# Patient Record
Sex: Female | Born: 1970 | Race: White | Hispanic: No | Marital: Married | Smoking: Never smoker
Health system: Southern US, Community
[De-identification: ages and names within clinical notes are randomized; demographics above are authoritative.]

## PROBLEM LIST (undated history)

## (undated) HISTORY — PX: EYE SURGERY: SHX253

---

## 2011-04-10 ENCOUNTER — Other Ambulatory Visit: Payer: Self-pay | Admitting: Obstetrics and Gynecology

## 2011-04-10 DIAGNOSIS — N644 Mastodynia: Secondary | ICD-10-CM

## 2011-04-23 ENCOUNTER — Ambulatory Visit
Admission: RE | Admit: 2011-04-23 | Discharge: 2011-04-23 | Disposition: A | Discharge status: Home / Self Care | Payer: BC Managed Care – PPO | Source: Ambulatory Visit | Attending: Obstetrics and Gynecology | Admitting: Obstetrics and Gynecology

## 2011-04-23 DIAGNOSIS — N644 Mastodynia: Secondary | ICD-10-CM

## 2017-03-12 ENCOUNTER — Telehealth: Payer: Self-pay | Admitting: Gastroenterology

## 2017-03-12 NOTE — Telephone Encounter (Signed)
Patient called back confirmed appointment on 03/18/17 at 9:30am states she did not have any other questions.

## 2017-03-12 NOTE — Telephone Encounter (Signed)
11/26 @ 930 am appt with Christella HartiganJacobs

## 2017-03-12 NOTE — Telephone Encounter (Signed)
Left message on machine to call back  

## 2017-03-12 NOTE — Telephone Encounter (Signed)
Allison Walter has had some chronic right lower quadrant pains for several months.  She called me last night to talk about it.  Her gynecologist has referred her to see me.  [name], can you find a time next week for an office appointment with me, double book if needed, preferably in the afternoon  I believe she's had labs and CT scan through her gynecologist office.  Can you get those sent here for review as well.  Thanks her contact number, cell number is [phone]

## 2017-03-18 ENCOUNTER — Ambulatory Visit (INDEPENDENT_AMBULATORY_CARE_PROVIDER_SITE_OTHER): Payer: BLUE CROSS/BLUE SHIELD | Admitting: Gastroenterology

## 2017-03-18 ENCOUNTER — Encounter (INDEPENDENT_AMBULATORY_CARE_PROVIDER_SITE_OTHER): Payer: Self-pay

## 2017-03-18 ENCOUNTER — Encounter: Payer: Self-pay | Admitting: Gastroenterology

## 2017-03-18 VITALS — BP 108/82 | HR 76 | Ht 63.0 in | Wt 138.4 lb

## 2017-03-18 DIAGNOSIS — R1031 Right lower quadrant pain: Secondary | ICD-10-CM

## 2017-03-18 NOTE — Patient Instructions (Addendum)
Records from PCP(labs, imaging results) Billee Cashing(Sarah Spencer MD through [facility]).  You will be set up for an ultrasound in 6 months (liver lesion in CT 02/2017 that was likely hemangioma.  Follow up PRN (call Dr. Christella HartiganJacobs if you want to proceed with colonoscopy).  Normal BMI (Body Mass Index- based on height and weight) is between 19 and 25. Your BMI today is Body mass index is 24.51 kg/m. Marland Kitchen. Please consider follow up  regarding your BMI with your Primary Care Provider.

## 2017-03-18 NOTE — Progress Notes (Signed)
HPI: This is a   very pleasant 46 year old woman who is a friend of mine, who was referred by her primary care physician for chronic right lower quadrant pain  Chief complaint is chronic right lower quadrant, low right pelvic pain  She has had a nagging, pinching sensation in her right lower quadrant, right pelvis for about 8 or 9 months now.  It is constant, not affected by moving her bowels or eating.  No position changes alter the pain.  She rates it 2 or 3 out of 10.  She cannot point to any specific injuries that occurred.  She has normal bowels without bleeding that have not changed in a long time.  Her weight is overall been stable  She has had transvaginal ultrasound, CT scan and blood work by her gynecologist and primary care physician.  These were all essentially normal per the patient and she showed me some of the results on her phone as well.   Old Data Reviewed: Cbc, cmet 02/2017 was normal. CT scan IV abd pelvis was normal except hemgangima in liver (fu US in 6 months)    Review of systems: Pertinent positive and negative review of systems were noted in the above HPI section. All other review negative.   History reviewed. No pertinent past medical history.  Past Surgical History:  Procedure Laterality Date  . EYE SURGERY Bilateral     No current outpatient medications on file.   No current facility-administered medications for this visit.     Allergies as of 03/18/2017  . (Not on File)    Family History  Problem Relation Age of Onset  . Dementia Father     Social History   Socioeconomic History  . Marital status: Married    Spouse name: Not on file  . Number of children: Not on file  . Years of education: Not on file  . Highest education level: Not on file  Social Needs  . Financial resource strain: Not on file  . Food insecurity - worry: Not on file  . Food insecurity - inability: Not on file  . Transportation needs - medical: Not on file  .  Transportation needs - non-medical: Not on file  Occupational History  . Not on file  Tobacco Use  . Smoking status: Never Smoker  . Smokeless tobacco: Never Used  Substance and Sexual Activity  . Alcohol use: Yes    Alcohol/week: 1.2 oz    Types: 2 Glasses of wine per week    Comment: 5 per week  . Drug use: No  . Sexual activity: Yes  Other Topics Concern  . Not on file  Social History Narrative  . Not on file     Physical Exam: BP 108/82   Pulse 76   Ht 5\' 3"  (1.6 m)   Wt 138 lb 6 oz (62.8 kg)   BMI 24.51 kg/m  Constitutional: generally well-appearing Psychiatric: alert and oriented x3 Eyes: extraocular movements intact Mouth: oral pharynx moist, no lesions Neck: supple no lymphadenopathy Cardiovascular: heart regular rate and rhythm Lungs: clear to auscultation bilaterally Abdomen: soft, very mildly tender right low pelvis, right lower quadrant, nondistended, no obvious ascites, no peritoneal signs, normal bowel sounds Extremities: no lower extremity edema bilaterally Skin: no lesions on visible extremities   Assessment and plan: 46 y.o. female with chronic right lower quadrant, right pelvic low pain  Her pain is not associated with eating or moving her bowels or position changes.  Does not qualify as  irritable bowel or functional dyspepsia.  She has had outside imaging including CT scan abdomen pelvis, transvaginal ultrasound.  These were normal except for a small hemangioma in her liver is incidental.  I am going to arrange right upper quadrant ultrasound for follow-up to make sure this is a hemangioma nothing else in 6 months.  We will get records sent from her primary care and her gynecologist.   think this is very unlikely anything serious.  Perhaps she has de novo adhesive disease.  Perhaps pinched nerve.  Not sure that it is GI related.  I did explain that colonoscopy is commonly offered for workup to exclude Crohn's disease, neoplasm both of which I think are  highly unlikely.  In the end would not follow this clinically and she will let me know if she wants to proceed with colonoscopy at a time.    Please see the "Patient Instructions" section for addition details about the plan.   Rob Buntinganiel Bomani Oommen, MD [facility] 03/18/2017, 9:47 AM  Cc: No ref. provider found

## 2017-09-17 ENCOUNTER — Telehealth: Payer: Self-pay

## 2017-09-17 NOTE — Telephone Encounter (Signed)
-----   Message from Brandt Loosen, RN sent at 03/18/2017 10:05 AM EST ----- You will be set up for an ultrasound in 6 months (liver lesion in CT 02/2017 that was likely hemangioma.

## 2017-09-17 NOTE — Telephone Encounter (Signed)
Letter mailed to have the pt call and set up repeat US

## 2018-07-07 DIAGNOSIS — Z131 Encounter for screening for diabetes mellitus: Secondary | ICD-10-CM | POA: Diagnosis not present

## 2018-07-07 DIAGNOSIS — D3500 Benign neoplasm of unspecified adrenal gland: Secondary | ICD-10-CM | POA: Diagnosis not present

## 2018-07-07 DIAGNOSIS — E559 Vitamin D deficiency, unspecified: Secondary | ICD-10-CM | POA: Diagnosis not present

## 2018-07-07 DIAGNOSIS — Z13220 Encounter for screening for lipoid disorders: Secondary | ICD-10-CM | POA: Diagnosis not present

## 2018-07-07 DIAGNOSIS — Z0000 Encounter for general adult medical examination without abnormal findings: Secondary | ICD-10-CM | POA: Diagnosis not present

## 2018-07-07 DIAGNOSIS — D1800 Hemangioma unspecified site: Secondary | ICD-10-CM | POA: Diagnosis not present

## 2018-09-23 DIAGNOSIS — D1800 Hemangioma unspecified site: Secondary | ICD-10-CM | POA: Diagnosis not present

## 2018-09-23 DIAGNOSIS — Z01419 Encounter for gynecological examination (general) (routine) without abnormal findings: Secondary | ICD-10-CM | POA: Diagnosis not present

## 2018-09-23 DIAGNOSIS — R011 Cardiac murmur, unspecified: Secondary | ICD-10-CM | POA: Diagnosis not present

## 2018-09-23 DIAGNOSIS — D3500 Benign neoplasm of unspecified adrenal gland: Secondary | ICD-10-CM | POA: Diagnosis not present

## 2018-09-24 ENCOUNTER — Telehealth: Payer: Self-pay | Admitting: *Deleted

## 2018-09-24 NOTE — Telephone Encounter (Signed)
REFERRAL SENT TO SCHEDULING AND NOTES ON FILR FROM [facility] FROM [place] [phone]

## 2018-10-14 DIAGNOSIS — N841 Polyp of cervix uteri: Secondary | ICD-10-CM | POA: Diagnosis not present

## 2018-10-17 DIAGNOSIS — H524 Presbyopia: Secondary | ICD-10-CM | POA: Diagnosis not present

## 2020-02-23 ENCOUNTER — Other Ambulatory Visit: Payer: Self-pay

## 2020-02-23 ENCOUNTER — Encounter: Payer: Self-pay | Admitting: Internal Medicine

## 2020-02-23 ENCOUNTER — Ambulatory Visit (INDEPENDENT_AMBULATORY_CARE_PROVIDER_SITE_OTHER): Payer: BC Managed Care – PPO | Admitting: Internal Medicine

## 2020-02-23 VITALS — BP 140/88 | HR 75 | Ht 63.0 in | Wt 140.2 lb

## 2020-02-23 DIAGNOSIS — E063 Autoimmune thyroiditis: Secondary | ICD-10-CM

## 2020-02-23 DIAGNOSIS — E278 Other specified disorders of adrenal gland: Secondary | ICD-10-CM | POA: Diagnosis not present

## 2020-02-23 LAB — POTASSIUM: Potassium: 3.8 mEq/L (ref 3.5–5.1)

## 2020-02-23 MED ORDER — DEXAMETHASONE 1 MG PO TABS: ORAL_TABLET | ORAL | 0 refills | Status: AC

## 2020-02-23 NOTE — Patient Instructions (Addendum)
Please stop at the lab.  Please take the dexamethasone around 11 PM the night before coming for an 8 AM cortisol level.  We will order a new CT scan as discussed.  Please come back for a follow-up appointment in 1 year.   Adrenal Adenoma  An adrenal adenoma is a benign tumor of the glands that are located on top of each kidney (adrenal glands). These glands produce hormones. A benign tumor means that the growth is not cancer. A person may have one or more tumors in one or both glands. In almost all cases, adrenal adenomas do not cause any symptoms. These are called nonfunctional adenomas. In rare cases, an adenoma may produce high levels of hormones called cortisol or aldosterone. These tumors are called functional adenomas. Adrenal adenomas become more common as people grow older, but the tumors do not become cancer. However, nonfunctional adenomas may become functional. What are the causes? In most cases, the cause of this condition is not known. In very rare cases, the condition may be passed from parent to child (inherited). These include multiple endocrine neoplasia type 1 and familial adenomatous polyposis. These conditions cause adenomas in many areas of the body. What are the signs or symptoms? Symptoms of this condition depend on the type of adrenal adenoma that you have.  Nonfunctional adrenal adenomas usually do not cause any symptoms.  Symptoms of functional adrenal adenomas depend on which hormone is produced in high levels. ? Tumors that secrete cortisol cause a condition called Cushing's syndrome. Signs and symptoms include:  Increased fat in the upper body.  Tiredness and loss of energy.  Muscle weakness.  High blood pressure.  High blood sugar.  Bruising and purple stretch marks in the skin, usually on the upper body.  Facial hair, acne, and menstrual irregularities in women. ? Tumors that secrete aldosterone cause a condition called primary aldosteronism. Signs  and symptoms include:  High blood pressure that may be difficult to control.  Tiredness and loss of energy.  Headache.  Weakness or numbness.  Low potassium, low magnesium, or high sodium levels in your blood. How is this diagnosed? This condition may be diagnosed based on:  Your symptoms. Your health care provider may suspect the condition if you have signs and symptoms of a functional adenoma.  A physical exam.  Blood and urine tests to check for high levels of hormones.  Imaging studies to confirm the diagnosis. These may include: ? CT scan. ? MRI. ? PET scan.  Biopsy. For this test, a sample of the tumor is removed and examined in a lab. This is done in rare cases where other tests have not given a clear result. Adrenal adenomas are often found by chance when imaging studies of the abdomen are done for other reasons. How is this treated? Treatment for this condition depends on the type of the adrenal adenoma that you have. You may treated with:  Observation. This is done if you have a nonfunctional adrenal adenoma. For observation, you may need: ? Regular imaging studies to make sure the tumor is not growing. ? Blood or urine tests to make sure the tumor is not becoming functional.  Surgery. This is done if you have a functional adenoma. Surgery is the main treatment for this condition and usually cures it.  Medicines. These are used if surgery is not possible. The medicines block the effects of the hormones. Follow these instructions at home:  Take over-the-counter and prescription medicines only as told by  your health care provider.  Return to your normal activities as told by your health care provider. Ask your health care provider what activities are safe for you.  Keep all follow-up visits as told by your health care provider. This is important. This may include visits for regular tests and imaging studies. Contact a health care provider if:  You develop any of  the signs or symptoms of Cushing's syndrome or primary aldosteronism. Summary  An adrenal adenoma is a benign tumor of the adrenal gland.  Nonfunctional adenomas rarely cause symptoms and do not need to be treated.  Functional adenomas may cause symptoms of Cushing's syndrome or primary aldosteronism.  Adrenal adenomas do not become cancerous. Nonfunctional adenomas may become functional.  Surgery to remove the tumor is the usual treatment for functional adenomas. This information is not intended to replace advice given to you by your health care provider. Make sure you discuss any questions you have with your health care provider. Document Revised: 04/17/2018 Document Reviewed: 04/17/2018 Elsevier Patient Education  2020 [employer].

## 2020-02-23 NOTE — Progress Notes (Addendum)
Patient ID: Allison Walter, female   DOB: 1971/01/10, 49 y.o.   MRN: [id number]   This visit occurred during the SARS-CoV-2 public health emergency.  Safety protocols were in place, including screening questions prior to the visit, additional usage of staff PPE, and extensive cleaning of exam room while observing appropriate contact time as indicated for disinfecting solutions.   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  Pt's adrenal mass was incidentally found during investigation for a hepatic hemangioma diagnosed 02/2017.  I reviewed pt's previous imaging tests: CT with contrast (03/11/2017): The right adrenal gland is normal. There is a 9 mm nodule in the left adrenal gland which has Hounsfield units measuring 27 on the portal venous phase images and 16 on the delayed phase images, changes most consistent with a small adenoma.   Of note, she does not have a history of hypokalemia. 07/07/2018: Glucose 85, BUN/creatinine 9/0.71, potassium 4.2 (3.5-5.2)  She has regular menses.  No h/o HTN, glucose intolerance.   No weight gain in last 2 years.   No skin changes including purple, wide, stretch marks. She does have hair loss - chronic.  She has a history of an abnormal TSH in 2016, after which her TSH levels normalized.  Recent antithyroid antibody levels were elevated.: 10/2019: TSH 2.61, free T4 1.0, free T3 3.1, TPO 220 (<9), ATA 5 (<1) 03/11/2017: TSH 3.72 01/01/2006: TSH 3.49 07/28/2014: TSH 5.43 (0.45-4.5) No results found for: TSH   No FH of Thyroid ds. No h/o RxTx to head and neck.  On B complex (with 400 mcg Biotin), last dose yesterday. On L-theanine.  ROS: Constitutional: no weight gain/loss, no fatigue, no subjective hyperthermia/hypothermia Eyes: no blurry vision, no xerophthalmia ENT: no sore throat, no nodules palpated in throat, no dysphagia/odynophagia, no  hoarseness Cardiovascular: no CP/SOB/palpitations/leg swelling Respiratory: no cough/SOB Gastrointestinal: no N/V/D/C Musculoskeletal: no muscle/joint aches Skin: no rashes, + hair loss Neurological: no tremors/numbness/tingling/dizziness Psychiatric: no depression/anxiety  History reviewed. No pertinent past medical history. Past Surgical History:  Procedure Laterality Date  . EYE SURGERY Bilateral    Blepharoplasty   Social History   Socioeconomic History  . Marital status: Married    Spouse name: Not on file  . Number of children: 1  . Years of education: Not on file  . Highest education level: Not on file  Occupational History  . Occupation: Doctor, general practice at [employer]  Tobacco Use  . Smoking status: Never Smoker  . Smokeless tobacco: Never Used  Vaping Use  . Vaping Use: Never used  Substance and Sexual Activity  . Alcohol use: Yes    Alcohol/week: 2.0 - 3.0 standard drinks    Types: 2 - 3 Glasses of wine per week    Comment: 4 per week  . Drug use: No  . Sexual activity: Yes  Other Topics Concern  . Not on file  Social History Narrative   Exercises: Aerobic and strengthening 2-4 times a week   Social Determinants of Health   Financial Resource Strain:   . Difficulty of Paying Living Expenses: Not on file  Food Insecurity:   . Worried About Programme researcher, broadcasting/film/video in the Last Year: Not on file  . Ran Out of Food in the Last Year: Not on file  Transportation Needs:   . Lack of Transportation (Medical): Not on file  . Lack of Transportation (Non-Medical): Not on  file  Physical Activity:   . Days of Exercise per Week: Not on file  . Minutes of Exercise per Session: Not on file  Stress:   . Feeling of Stress : Not on file  Social Connections:   . Frequency of Communication with Friends and Family: Not on file  . Frequency of Social Gatherings with Friends and Family: Not on file  . Attends Religious Services: Not on file  . Active Member of Clubs or  Organizations: Not on file  . Attends Banker Meetings: Not on file  . Marital Status: Not on file  Intimate Partner Violence:   . Fear of Current or Ex-Partner: Not on file  . Emotionally Abused: Not on file  . Physically Abused: Not on file  . Sexually Abused: Not on file   No current outpatient medications on file prior to visit.   No current facility-administered medications on file prior to visit.   No Known Allergies Family History  Problem Relation Age of Onset  . Dementia Father     PE: BP 140/88   Pulse 75   Ht 5\' 3"  (1.6 m)   Wt 140 lb 3.2 oz (63.6 kg)   SpO2 99%   BMI 24.84 kg/m  Wt Readings from Last 3 Encounters:  02/23/20 140 lb 3.2 oz (63.6 kg)  03/18/17 138 lb 6 oz (62.8 kg)   Constitutional: normal weight, in NAD Eyes: PERRLA, EOMI, no exophthalmos, no lid lag, no stare ENT: moist mucous membranes, no thyromegaly, no cervical lymphadenopathy Cardiovascular: RRR, No MRG Respiratory: CTA B Gastrointestinal: abdomen soft, NT, ND, BS+ Musculoskeletal: no deformities, strength intact in all 4 Skin: moist, warm, no rashes Neurological: no tremor with outstretched hands, DTR normal in all 4  ASSESSMENT: 1. Adrenal incidentaloma  2.  Hashimoto's thyroiditis  PLAN:  1. Patient with a L adrenal nodule discovered incidentally. - We discussed about the fact that there are 3 possible scenarios: - A nonfunctioning adrenal nodule  - A functioning adrenal adenoma - which can hypersecrete catecholamines/metanephrines, cortisol, or aldosterone -however, she does not have any symptoms of hyper production - Adrenal cancer/metastasis -no previous history of cancer - To differentiate between a functioning and a nonfunctioning adrenal nodule, we'll need to rule out hypersecretion by checking the following tests: - dexamethasone suppression test to rule out Cushing syndrome (6% of adrenal incidentalomas). If the cortisol level returns >5, will need 24h urine  free cortisol - Plasma fractionated metanephrines and catecholamines to rule out pheochromocytoma (3% of adrenal incidentalomas) - Plasma renin activity and aldosterone level to rule out primary hyperaldosteronism (0.6% of adrenal incidentalomas) - I ordered the above tests today. I advised pt to take the dexamethasone tablets (1 mg total dose, sent to pharmacy) at 11 PM the night before coming to the lab to have a cortisol level drawn. I also added dexamethasone level. -We reviewed the results of her CT scan from 2018.  This was done with contrast, so it cannot be inferred whether she has a lipid-rich nodule (for which, no further investigation would be needed) or an adenoma. W discussed about the need for another, adrenal CT. I will need to order it with and without contrast, if the Hounsfield units are low and the lesion again appears benign, we might not need to get the contrasted CT for washout.  - we discussed about f/u:   hormonal testing yearly for 5 years   CT scans yearly x1-2  - I explained all the above to  the patient, and she agrees with the plan. - I will see her back in a year  2.  Hashimoto's thyroiditis -She had a slightly abnormal TSH in 2016, after which, levels normalized -She recently had detailed thyroid labs in 10/2019 by another provider  - all TFTs were normal, but antithyroid antibodies were elevated, pointing towards Hashimoto's thyroiditis - we had a long discussion about her Hashimoto thyroiditis diagnosis. I explained that this is an autoimmune disorder, in which she develops antibodies against her own thyroid. The antibodies bind to the thyroid tissue and cause inflammation, and, eventually, destruction of the gland and hypothyroidism. We don't know how long this process can be, it can last from months to years. As of now, based on the last results that I have, her thyroid tests are normal. - I also explained that thyroid enlargement especially at the beginning of her  Hashimoto thyroiditis course is not uncommon, and it has a waxing and waning character.  - We discussed about treatment for Hashimoto thyroiditis, which is actually limited to thyroid hormones in case her TFTs are abnormal. Supplements like selenium has been tried with various results, some showing improvement in the TPO antibodies. However, there are no randomized controlled trials of this are consistent results between trials. We also discussed about ways to improve her immune system (relaxation, diet, exercise, sleep) to reduce the Ab titer and, subsequently, the thyroid inflammation.  She already eliminated dairy.   -We can continue to follow her for this-I will recheck her TFTs at next visit.  Component     Latest Ref Rng & Units 02/23/2020  Epinephrine     pg/mL 22  Norepinephrine     pg/mL 726  Dopamine     pg/mL <10  Total Catecholamines     pg/mL 748  Metanephrine, Pl     <=57 pg/mL <25  Normetanephrine, Pl     <=148 pg/mL 110  Total Metanephrines-Plasma     <=205 pg/mL 110  ALDOSTERONE     ng/dL 2  Renin Activity     5.46 - 5.82 ng/mL/h 1.02  ALDO / PRA Ratio     0.9 - 28.9 Ratio 2.0  Potassium     3.5 - 5.1 mEq/L 3.8  All labs are normal.  CT abdomen with and without contrast (03/22/2020): Lower chest: Lung bases are clear. Heart size normal. No pericardial or pleural effusion.  Hepatobiliary: Subcentimeter low-attenuation lesion in the periphery of the right hepatic lobe, too small to characterize but likely a benign lesion such as a cyst or hemangioma. Liver and gallbladder are otherwise unremarkable. No biliary ductal dilatation.  Pancreas: Negative.  Spleen: Negative.  Adrenals/Urinary Tract: Right adrenal gland is unremarkable. 1.5 cm nodule in the left adrenal gland is fluid in density on precontrast imaging. Tiny stones in the kidneys. Millimetric low-attenuation lesion in the right kidney is too small to characterize but statistically, a cyst is  likely.  Stomach/Bowel: Stomach and visualized portions of the small bowel and colon are unremarkable.  Vascular/Lymphatic: Vascular structures are unremarkable. No pathologically enlarged lymph nodes.  Other: No free fluid.  Mesenteries and peritoneum are unremarkable.  Musculoskeletal: None.  IMPRESSION: 1. Benign left adrenal adenoma. 2. Punctate bilateral renal stones.   Electronically Signed   By: Leanna Battles M.D.   On: 03/23/2020 09:18    Carlus Pavlov, MD PhD [facility]

## 2020-03-08 LAB — METANEPHRINES, PLASMA
Metanephrine, Free: <25 pg/mL (ref <=57)
Normetanephrine, Free: 110 pg/mL (ref <=148)
Total Metanephrines-Plasma: 110 pg/mL (ref <=205)

## 2020-03-08 LAB — CATECHOLAMINES, FRACTIONATED, PLASMA
Dopamine: <10 pg/mL
Epinephrine: 22 pg/mL
Norepinephrine: 726 pg/mL
Total Catecholamines: 748 pg/mL

## 2020-03-08 LAB — ALDOSTERONE + RENIN ACTIVITY W/ RATIO
ALDO / PRA Ratio: 2 Ratio (ref 0.9–28.9)
Aldosterone: 2 ng/dL
Renin Activity: 1.02 ng/mL/h (ref 0.25–5.82)

## 2020-03-22 ENCOUNTER — Ambulatory Visit
Admission: RE | Admit: 2020-03-22 | Discharge: 2020-03-22 | Disposition: A | Discharge status: Home / Self Care | Payer: BC Managed Care – PPO | Source: Ambulatory Visit | Attending: Internal Medicine | Admitting: Internal Medicine

## 2020-03-22 DIAGNOSIS — E278 Other specified disorders of adrenal gland: Secondary | ICD-10-CM | POA: Diagnosis not present

## 2020-03-22 DIAGNOSIS — N200 Calculus of kidney: Secondary | ICD-10-CM | POA: Diagnosis not present

## 2020-03-22 DIAGNOSIS — D3502 Benign neoplasm of left adrenal gland: Secondary | ICD-10-CM | POA: Diagnosis not present

## 2020-03-22 MED ORDER — IOPAMIDOL (ISOVUE-300) INJECTION 61%
100.0000 mL | Freq: Once | INTRAVENOUS | Status: AC | PRN
  Administered 2020-03-22: 100 mL via INTRAVENOUS

## 2020-04-19 DIAGNOSIS — D369 Benign neoplasm, unspecified site: Secondary | ICD-10-CM | POA: Diagnosis not present

## 2020-04-19 DIAGNOSIS — Z1211 Encounter for screening for malignant neoplasm of colon: Secondary | ICD-10-CM | POA: Diagnosis not present

## 2020-04-19 DIAGNOSIS — Z0000 Encounter for general adult medical examination without abnormal findings: Secondary | ICD-10-CM | POA: Diagnosis not present

## 2020-04-19 DIAGNOSIS — D1803 Hemangioma of intra-abdominal structures: Secondary | ICD-10-CM | POA: Diagnosis not present

## 2020-04-21 DIAGNOSIS — Z1231 Encounter for screening mammogram for malignant neoplasm of breast: Secondary | ICD-10-CM | POA: Diagnosis not present

## 2020-04-28 DIAGNOSIS — Z131 Encounter for screening for diabetes mellitus: Secondary | ICD-10-CM | POA: Diagnosis not present

## 2020-04-28 DIAGNOSIS — Z130 Encounter for screening for diseases of the blood and blood-forming organs and certain disorders involving the immune mechanism: Secondary | ICD-10-CM | POA: Diagnosis not present

## 2020-04-28 DIAGNOSIS — E063 Autoimmune thyroiditis: Secondary | ICD-10-CM | POA: Diagnosis not present

## 2020-04-28 DIAGNOSIS — Z13220 Encounter for screening for lipoid disorders: Secondary | ICD-10-CM | POA: Diagnosis not present

## 2020-04-28 DIAGNOSIS — E559 Vitamin D deficiency, unspecified: Secondary | ICD-10-CM | POA: Diagnosis not present

## 2020-07-05 DIAGNOSIS — H5212 Myopia, left eye: Secondary | ICD-10-CM | POA: Diagnosis not present

## 2020-07-05 DIAGNOSIS — H524 Presbyopia: Secondary | ICD-10-CM | POA: Diagnosis not present

## 2020-07-05 DIAGNOSIS — H52222 Regular astigmatism, left eye: Secondary | ICD-10-CM | POA: Diagnosis not present

## 2021-02-18 IMAGING — CT CT ABDOMEN WO/W CM
3 of 10 series · 10 of 46 positions shown, 16 images · IV contrast (iopamidol)
Comparison: None.

CLINICAL DATA: Adrenal mass.

EXAM:
CT ABDOMEN WITHOUT AND WITH CONTRAST
TECHNIQUE: Multidetector CT imaging of the abdomen was performed following the
standard protocol before and following the bolus administration of
intravenous contrast.
CONTRAST:  100mL 1YZKJT-J99 IOPAMIDOL ([id number]-J99) INJECTION 61%

[Series 2: adrenals 3.00 br40 s3 ax without · axial · non-contrast · 0.65mm/px · z∈[+1276,+1303]mm · 2 of 69 slices shown]
[im 9/69  soft-tissue]
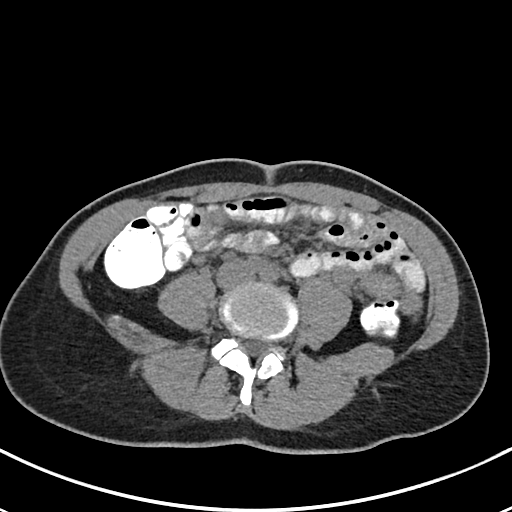
[im 18/69  soft-tissue]
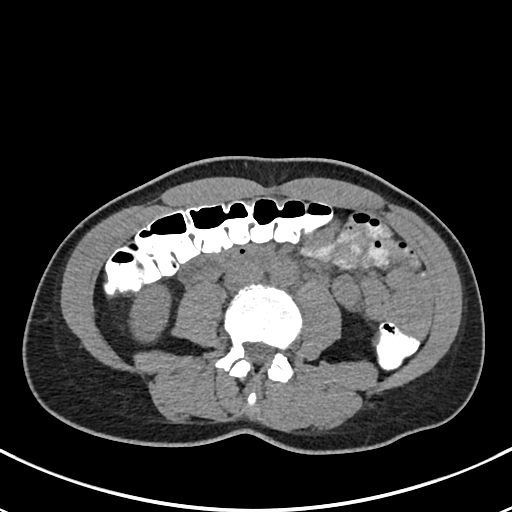

[Series 6: adrenals portal venous 3.00 br40 s3 axial cm · axial · portal-venous · 0.66mm/px · z∈[+1286,+1406]mm · 5 of 62 slices shown, 10 images]
[im 11/62  soft-tissue]
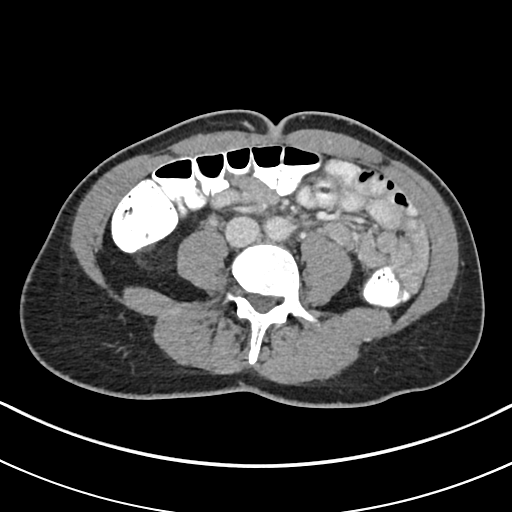
[im 11/62  bone]
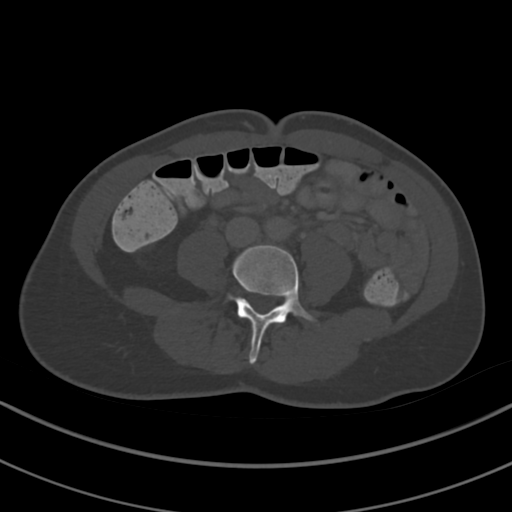
[im 21/62  soft-tissue]
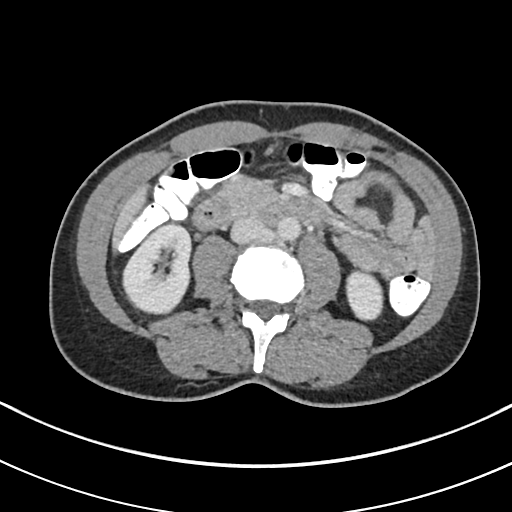
[im 21/62  lung]
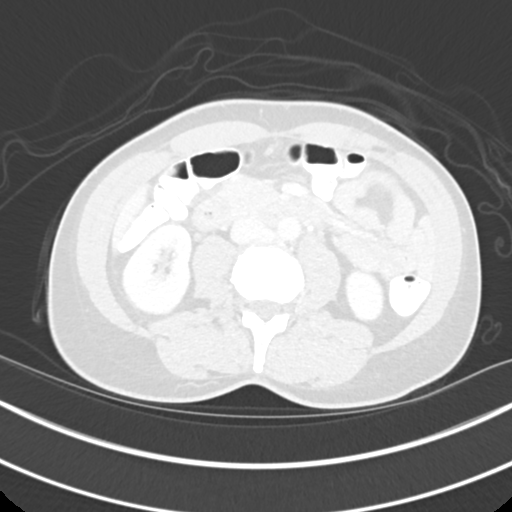
[im 31/62  soft-tissue]
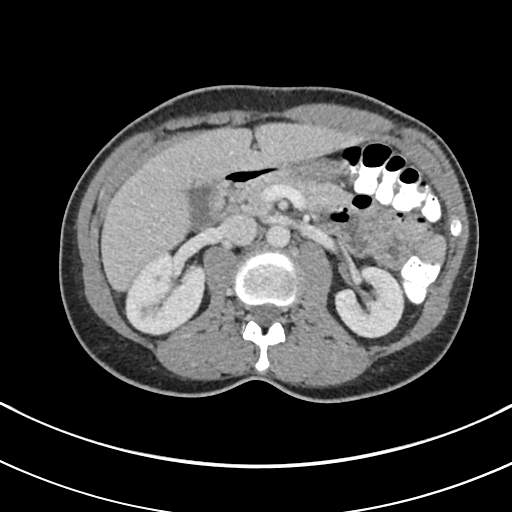
[im 31/62  lung]
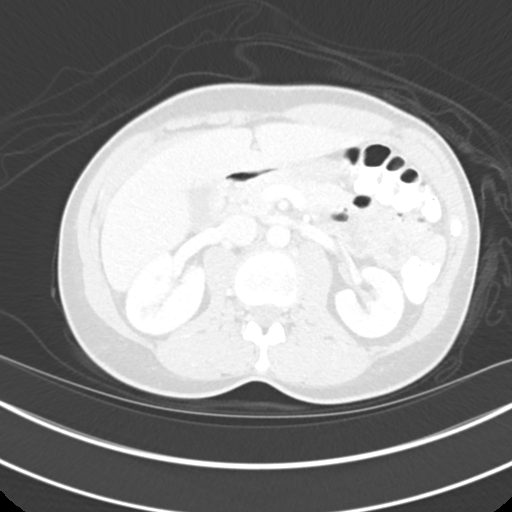
[im 41/62  soft-tissue]
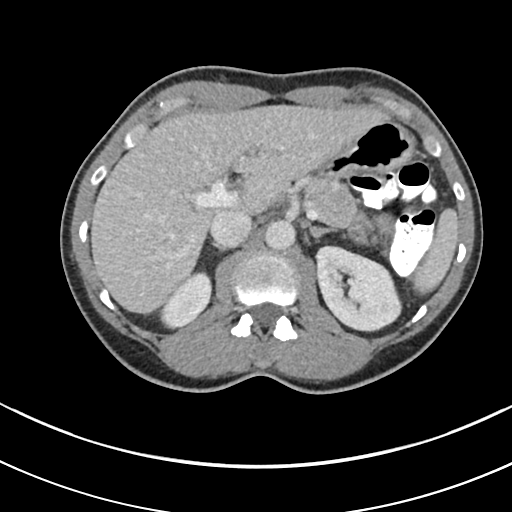
[im 41/62  lung]
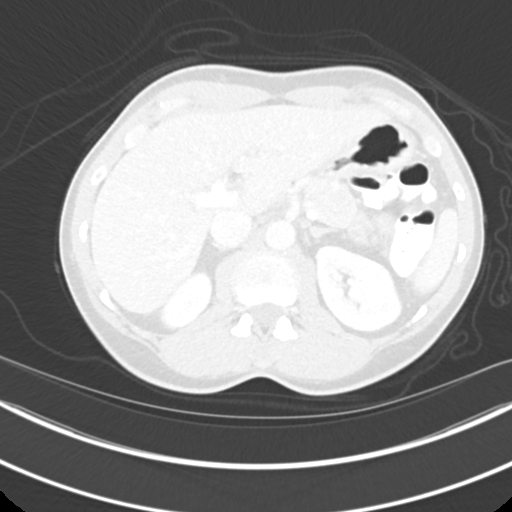
[im 51/62  soft-tissue]
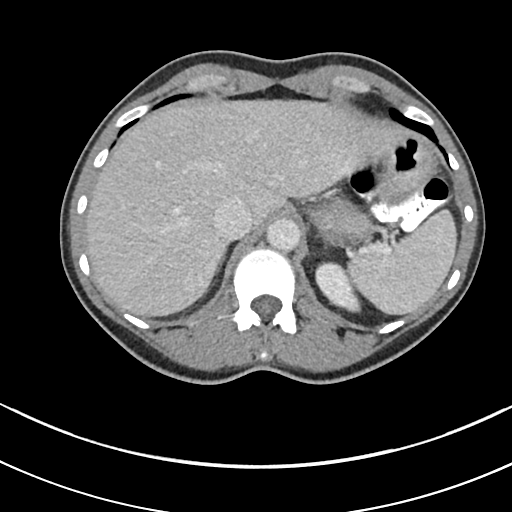
[im 51/62  lung]
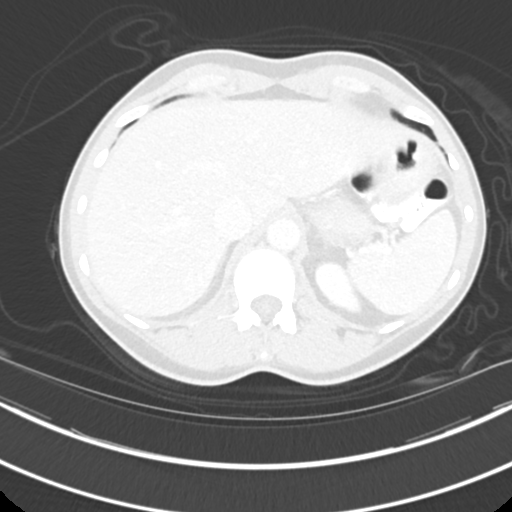

[Series 8: adrenals portal venous 3.00 br40 s3 cor cm · coronal · portal-venous · 0.36mm/px · 3 of 113 slices shown, 4 images]
[im 29/113  soft-tissue]
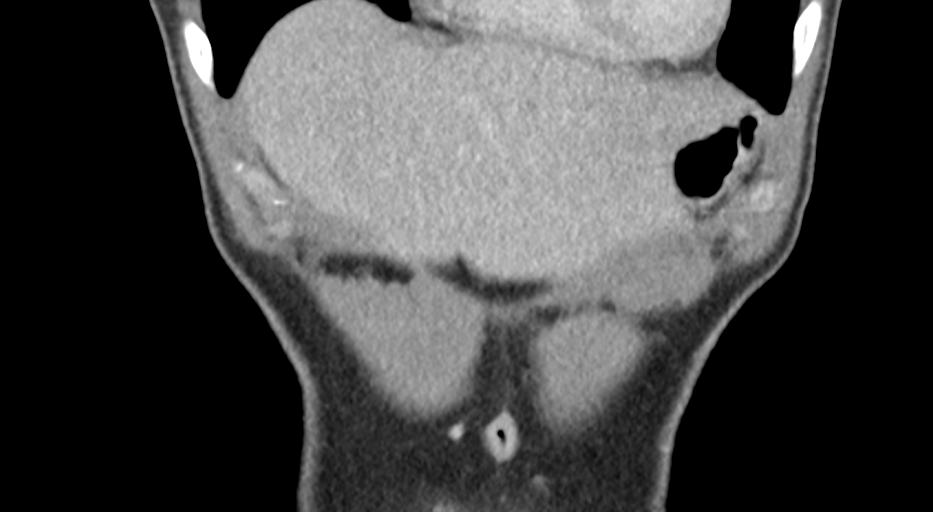
[im 57/113  soft-tissue]
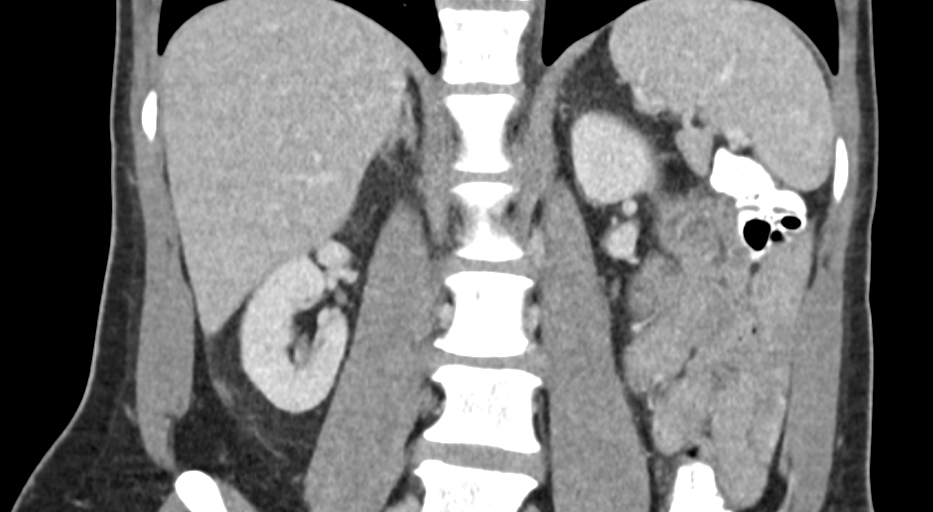
[im 57/113  bone]
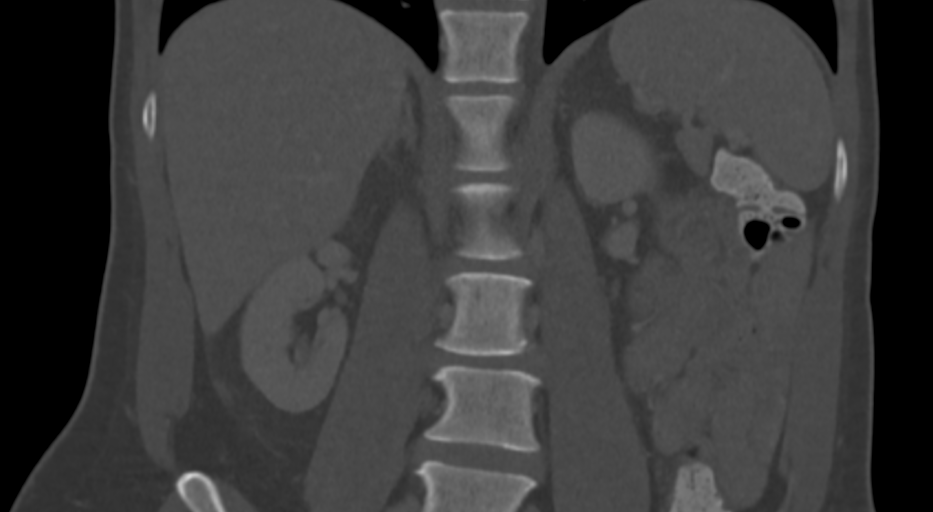
[im 85/113  soft-tissue]
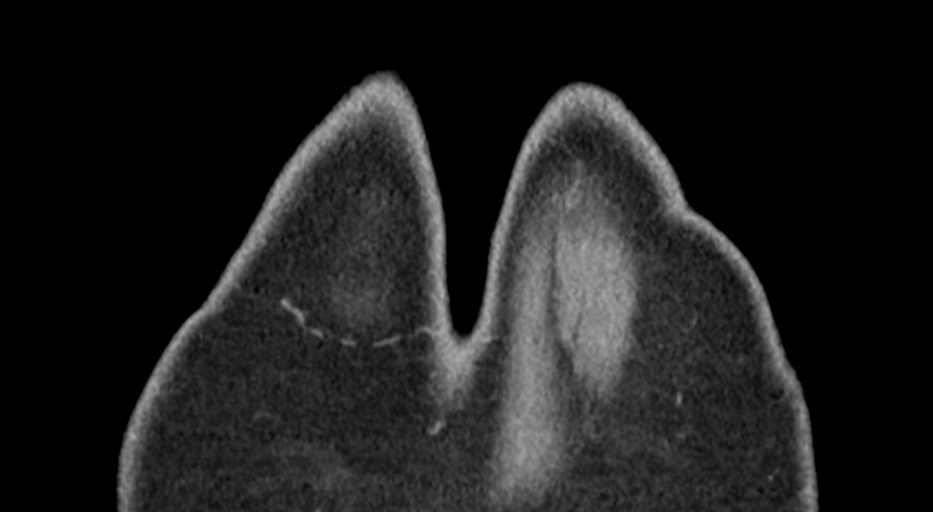

[10 of 46 positions shown; findings below may reference images not displayed]

FINDINGS: Lower chest: Lung bases are clear. Heart size normal. No pericardial
or pleural effusion.

Hepatobiliary: Subcentimeter low-attenuation lesion in the periphery
of the right hepatic lobe, too small to characterize but likely a
benign lesion such as a cyst or hemangioma. Liver and gallbladder
are otherwise unremarkable. No biliary ductal dilatation.

Pancreas: Negative.

Spleen: Negative.

Adrenals/Urinary Tract: Right adrenal gland is unremarkable. 1.5 cm
nodule in the left adrenal gland is fluid in density on precontrast
imaging. Tiny stones in the kidneys. Millimetric low-attenuation
lesion in the right kidney is too small to characterize but
statistically, a cyst is likely.

Stomach/Bowel: Stomach and visualized portions of the small bowel
and colon are unremarkable.

Vascular/Lymphatic: Vascular structures are unremarkable. No
pathologically enlarged lymph nodes.

Other: No free fluid.  Mesenteries and peritoneum are unremarkable.

Musculoskeletal: None.
IMPRESSION: 1. Benign left adrenal adenoma.
2. Punctate bilateral renal stones.

## 2021-02-24 ENCOUNTER — Ambulatory Visit: Payer: BC Managed Care – PPO | Admitting: Internal Medicine

## 2021-12-07 ENCOUNTER — Institutional Professional Consult (permissible substitution): Payer: BC Managed Care – PPO | Admitting: Neurology
